# Patient Record
Sex: Female | Born: 1970 | Race: White | Hispanic: No | Marital: Married | Smoking: Never smoker
Health system: Southern US, Community
[De-identification: ages and names within clinical notes are randomized; demographics above are authoritative.]

## PROBLEM LIST (undated history)

## (undated) DIAGNOSIS — Z9889 Other specified postprocedural states: Secondary | ICD-10-CM

## (undated) DIAGNOSIS — T4145XA Adverse effect of unspecified anesthetic, initial encounter: Secondary | ICD-10-CM

## (undated) DIAGNOSIS — N200 Calculus of kidney: Secondary | ICD-10-CM

## (undated) DIAGNOSIS — Z9221 Personal history of antineoplastic chemotherapy: Secondary | ICD-10-CM

## (undated) DIAGNOSIS — K219 Gastro-esophageal reflux disease without esophagitis: Secondary | ICD-10-CM

## (undated) DIAGNOSIS — R112 Nausea with vomiting, unspecified: Secondary | ICD-10-CM

## (undated) DIAGNOSIS — T8859XA Other complications of anesthesia, initial encounter: Secondary | ICD-10-CM

## (undated) DIAGNOSIS — J45909 Unspecified asthma, uncomplicated: Secondary | ICD-10-CM

## (undated) DIAGNOSIS — C801 Malignant (primary) neoplasm, unspecified: Secondary | ICD-10-CM

## (undated) HISTORY — DX: Calculus of kidney: N20.0

## (undated) HISTORY — DX: Unspecified asthma, uncomplicated: J45.909

## (undated) HISTORY — PX: ABDOMINAL HYSTERECTOMY: SHX81

## (undated) HISTORY — PX: CARPAL TUNNEL RELEASE: SHX101

## (undated) HISTORY — DX: Gastro-esophageal reflux disease without esophagitis: K21.9

---

## 1999-01-06 ENCOUNTER — Encounter: Payer: Self-pay | Admitting: Family Medicine

## 1999-01-06 ENCOUNTER — Encounter: Admission: RE | Admit: 1999-01-06 | Discharge: 1999-01-06 | Payer: Self-pay | Admitting: Family Medicine

## 2006-03-05 ENCOUNTER — Observation Stay: Payer: Self-pay

## 2006-03-12 ENCOUNTER — Observation Stay: Payer: Self-pay | Admitting: Obstetrics and Gynecology

## 2006-03-19 ENCOUNTER — Observation Stay: Payer: Self-pay

## 2006-03-26 ENCOUNTER — Observation Stay: Payer: Self-pay | Admitting: Obstetrics and Gynecology

## 2006-04-02 ENCOUNTER — Observation Stay: Payer: Self-pay

## 2006-04-09 ENCOUNTER — Observation Stay: Payer: Self-pay

## 2006-04-13 ENCOUNTER — Ambulatory Visit: Payer: Self-pay | Admitting: Obstetrics and Gynecology

## 2006-04-14 ENCOUNTER — Inpatient Hospital Stay: Payer: Self-pay | Admitting: Obstetrics and Gynecology

## 2007-01-27 HISTORY — PX: ABDOMINAL HYSTERECTOMY: SUR658

## 2007-05-01 ENCOUNTER — Observation Stay: Payer: Self-pay | Admitting: Specialist

## 2007-05-01 ENCOUNTER — Other Ambulatory Visit: Payer: Self-pay

## 2007-06-13 ENCOUNTER — Ambulatory Visit: Payer: Self-pay | Admitting: Specialist

## 2007-12-01 ENCOUNTER — Ambulatory Visit: Payer: Self-pay | Admitting: Obstetrics and Gynecology

## 2007-12-07 ENCOUNTER — Ambulatory Visit: Payer: Self-pay | Admitting: Obstetrics and Gynecology

## 2007-12-08 ENCOUNTER — Ambulatory Visit: Payer: Self-pay | Admitting: Obstetrics and Gynecology

## 2008-01-05 ENCOUNTER — Ambulatory Visit: Payer: Self-pay | Admitting: Obstetrics and Gynecology

## 2008-01-12 ENCOUNTER — Ambulatory Visit: Payer: Self-pay | Admitting: Obstetrics and Gynecology

## 2008-01-16 ENCOUNTER — Inpatient Hospital Stay: Payer: Self-pay | Admitting: Obstetrics and Gynecology

## 2008-12-11 ENCOUNTER — Ambulatory Visit: Payer: Self-pay | Admitting: Obstetrics and Gynecology

## 2009-01-26 ENCOUNTER — Ambulatory Visit: Payer: Self-pay | Admitting: Internal Medicine

## 2009-02-20 ENCOUNTER — Ambulatory Visit: Payer: Self-pay | Admitting: Internal Medicine

## 2009-02-26 ENCOUNTER — Ambulatory Visit: Payer: Self-pay | Admitting: Internal Medicine

## 2009-03-26 ENCOUNTER — Ambulatory Visit: Payer: Self-pay | Admitting: Internal Medicine

## 2009-05-28 ENCOUNTER — Ambulatory Visit: Payer: Self-pay | Admitting: Internal Medicine

## 2009-06-26 ENCOUNTER — Ambulatory Visit: Payer: Self-pay | Admitting: Internal Medicine

## 2009-07-26 ENCOUNTER — Ambulatory Visit: Payer: Self-pay | Admitting: Internal Medicine

## 2009-08-20 ENCOUNTER — Ambulatory Visit: Payer: Self-pay | Admitting: Internal Medicine

## 2009-08-26 ENCOUNTER — Ambulatory Visit: Payer: Self-pay | Admitting: Internal Medicine

## 2010-02-20 ENCOUNTER — Ambulatory Visit: Payer: Self-pay | Admitting: Internal Medicine

## 2010-02-26 ENCOUNTER — Ambulatory Visit: Payer: Self-pay | Admitting: Internal Medicine

## 2010-08-26 ENCOUNTER — Ambulatory Visit: Payer: Self-pay | Admitting: Internal Medicine

## 2010-08-27 ENCOUNTER — Ambulatory Visit: Payer: Self-pay | Admitting: Internal Medicine

## 2012-01-27 HISTORY — PX: CARPAL TUNNEL RELEASE: SHX101

## 2012-03-31 ENCOUNTER — Ambulatory Visit: Payer: Self-pay | Admitting: Obstetrics and Gynecology

## 2012-07-12 ENCOUNTER — Ambulatory Visit: Payer: Self-pay | Admitting: Orthopedic Surgery

## 2012-07-14 LAB — PATHOLOGY REPORT

## 2014-05-18 NOTE — Op Note (Signed)
PATIENT NAME:  Maria Cannon, Maria Cannon MR#:  [id number] DATE OF BIRTH:  October 08, 1970  DATE OF PROCEDURE:  07/12/2012  PREOPERATIVE DIAGNOSES:  Right hand carpal tunnel syndrome and cyst.   POSTOPERATIVE DIAGNOSES:  Mass, right hand, carpal tunnel syndrome.   PROCEDURES:  Carpal tunnel release and excision of mass, right hand.   ANESTHESIA:  General.   SURGEON:  Laurene Footman, MD  DESCRIPTION OF PROCEDURE:  The patient was brought to the operating room and after adequate anesthesia was obtained, the right arm was prepped and draped in the usual sterile fashion. After patient identification and timeout procedures were completed, the tourniquet was raised to 250 mmHg. After having completed the appropriate patient identification and timeout procedure, the carpal tunnel was addressed first with incision in line with the ring metacarpal approximately 2 cm in length. The subcutaneous tissue was spread and the transverse carpal ligament identified. This was incised and a vascular hemostat was placed underneath the ligament to protect the underlying structures. Release was carried out distally until there was fat noted around the nerve. Going proximally, there was compression in the midportion of the carpal tunnel with some deformity to the nerve. After release proximally, there was good vascular blush to the nerve. Exploration of the carpal tunnel revealed no masses. There was mild flexor tenosynovitis. The ligament did appear to be thick. The wound was irrigated and then closed with simple interrupted 4-0 nylon with 10 mL of 0.5% Sensorcaine plain for postoperative analgesia.   Next, incision was made between the third and fourth metacarpal heads on the volar surface. There was a palpable mass. After incision through the skin, the subcutaneous tissues were spread and there was a large amount of fibrofatty tissue that came up from the wound. It appeared to be fibrous and light fatty tissue. This was removed and on  palpation of the wound after removal of this, the mass was no longer apparent. Dissection was carried down to the neurovascular bundle. There was no ganglion or other cyst identified. The wound was then irrigated and this wound was also closed with simple interrupted 4-0 nylon and 7 mL of 0.5% Sensorcaine was infiltrated proximal to the incision for postoperative analgesia. Xeroform, 4 x 4's, Webril and an Ace wrap were applied. The patient was sent to the recovery room in stable condition.   ESTIMATED BLOOD LOSS:  Minimal.   COMPLICATIONS:  None.   SPECIMEN:  The fibrofatty tissue from the palm.   TOURNIQUET TIME:  26 minutes at 250 mmHg.    ____________________________ Laurene Footman, MD mjm:si D: 07/12/2012 22:21:16 ET T: 07/12/2012 23:00:50 ET JOB#: [id number]  cc: Laurene Footman, MD, <Dictator> Laurene Footman MD ELECTRONICALLY SIGNED 07/13/2012 8:25

## 2014-07-12 ENCOUNTER — Other Ambulatory Visit: Payer: Self-pay | Admitting: Obstetrics and Gynecology

## 2014-07-12 DIAGNOSIS — Z1231 Encounter for screening mammogram for malignant neoplasm of breast: Secondary | ICD-10-CM

## 2014-07-13 ENCOUNTER — Ambulatory Visit
Admission: RE | Admit: 2014-07-13 | Discharge: 2014-07-13 | Disposition: A | Discharge status: Home / Self Care | Payer: Self-pay | Source: Ambulatory Visit | Attending: Obstetrics and Gynecology | Admitting: Obstetrics and Gynecology

## 2014-07-13 DIAGNOSIS — Z1231 Encounter for screening mammogram for malignant neoplasm of breast: Secondary | ICD-10-CM | POA: Insufficient documentation

## 2014-07-13 HISTORY — DX: Malignant (primary) neoplasm, unspecified: C80.1

## 2015-07-17 ENCOUNTER — Other Ambulatory Visit: Payer: Self-pay | Admitting: Obstetrics and Gynecology

## 2015-07-17 DIAGNOSIS — Z1231 Encounter for screening mammogram for malignant neoplasm of breast: Secondary | ICD-10-CM

## 2015-07-25 ENCOUNTER — Encounter: Payer: Self-pay | Admitting: Urology

## 2015-07-25 ENCOUNTER — Ambulatory Visit (INDEPENDENT_AMBULATORY_CARE_PROVIDER_SITE_OTHER): Payer: Self-pay | Admitting: Urology

## 2015-07-25 VITALS — BP 145/68 | HR 80 | Ht 66.0 in | Wt 140.1 lb

## 2015-07-25 DIAGNOSIS — R109 Unspecified abdominal pain: Secondary | ICD-10-CM

## 2015-07-25 DIAGNOSIS — R310 Gross hematuria: Secondary | ICD-10-CM

## 2015-07-25 LAB — URINALYSIS, COMPLETE
BILIRUBIN UA: NEGATIVE
GLUCOSE, UA: NEGATIVE
KETONES UA: NEGATIVE
Leukocytes, UA: NEGATIVE
Nitrite, UA: NEGATIVE
PROTEIN UA: NEGATIVE
RBC UA: NEGATIVE
SPEC GRAV UA: 1.025 (ref 1.005–1.030)
UUROB: 0.2 mg/dL (ref 0.2–1.0)
pH, UA: 6 (ref 5.0–7.5)

## 2015-07-25 LAB — MICROSCOPIC EXAMINATION: Bacteria, UA: NONE SEEN

## 2015-07-25 NOTE — Progress Notes (Signed)
[facility] Consult Referred by: Ouida Sills, MD  Chief Complaint: Back pain, dysuria  History of Present Illness: Patient reports a history of back pain on the left side and in the middle. Pain while urinating as well as a fulfilling and stabbing pains. Started about 1 month ago with fatigue and frequency.  Seen by Dr. Ouida Sills June 21 with a normal pelvic exam. UA showed a few wbc's and bacteria but no rbc's.   No gross hematuria - she has not seen blood or blood clots in urine. She had dysuria, urgency and frequency. Pain middle back, but also front, left and right. No Gi symptoms. Sharp stabbing pain. Azo no help. Now LLQ dull, Left low back - sharp. Pain worse at night. Very busy with VBS. Took nitrofurantoin with some relief. Heat, NSAIDs and oxycodone helped. Prior kidney stones. Never needed surgery. Passed two stones in the past.   No GU surgery. Prior c-section and interestingly a TVHx in 2009 for endometriosis and similar LLQ pain.   UA today clear.   Past Medical History  Diagnosis Date  . Cancer ([place]) skin  . GERD (gastroesophageal reflux disease)   . Asthma   . Kidney stones    Past Surgical History  Procedure Laterality Date  . Carpal tunnel release Bilateral 2014  . Abdominal hysterectomy  2009  . Cesarean section  2008    Home Medications:   (Not in a hospital admission) Allergies:  Allergies  Allergen Reactions  . Ciprofloxacin     Other reaction(s): Unknown    Family History  Problem Relation Age of Onset  . Adopted: Yes  . Kidney cancer Neg Hx   . Kidney disease Neg Hx   . Prostate cancer Neg Hx    Social History:  reports that she has never smoked. She does not have any smokeless tobacco history on file. She reports that she does not drink alcohol or use illicit drugs.  ROS: A complete review of systems was performed.  All systems are negative except for pertinent findings as noted. Review of Systems  Constitutional:  Positive for diaphoresis.  All other systems reviewed and are negative.    Physical Exam:  Vital signs in last 24 hours: General:  Alert and oriented, No acute distress HEENT: Normocephalic, atraumatic Cardiovascular: Regular rate and rhythm Lungs: Regular rate and effort Back: No CVA tenderness Extremities: No edema Neurologic: Grossly intact   Laboratory Data:  No results found for this or any previous visit (from the past 24 hour(s)). No results found for this or any previous visit (from the past 240 hour(s)). Creatinine: No results for input(s): CREATININE in the last 168 hours.  Impression/Assessment/plan:  Flank pain - history of kidney stones - plan KUB and renal U/S. There has been no microhematuria or gross hematuria.   Dysuria - UA clear. Uribel prn.    Leigha Olberding 07/25/2015, 3:05 PM

## 2015-08-01 ENCOUNTER — Other Ambulatory Visit: Payer: Self-pay | Admitting: Urology

## 2015-08-01 DIAGNOSIS — R109 Unspecified abdominal pain: Secondary | ICD-10-CM

## 2015-08-02 ENCOUNTER — Ambulatory Visit
Admission: RE | Admit: 2015-08-02 | Discharge: 2015-08-02 | Disposition: A | Discharge status: Home / Self Care | Payer: Self-pay | Source: Ambulatory Visit | Attending: Urology | Admitting: Urology

## 2015-08-02 ENCOUNTER — Ambulatory Visit
Admission: RE | Admit: 2015-08-02 | Discharge: 2015-08-02 | Disposition: A | Discharge status: Home / Self Care | Payer: Self-pay | Source: Ambulatory Visit | Attending: Obstetrics and Gynecology | Admitting: Obstetrics and Gynecology

## 2015-08-02 ENCOUNTER — Other Ambulatory Visit: Payer: Self-pay | Admitting: Obstetrics and Gynecology

## 2015-08-02 DIAGNOSIS — Z1231 Encounter for screening mammogram for malignant neoplasm of breast: Secondary | ICD-10-CM

## 2015-08-02 DIAGNOSIS — R109 Unspecified abdominal pain: Secondary | ICD-10-CM | POA: Insufficient documentation

## 2015-08-02 DIAGNOSIS — N200 Calculus of kidney: Secondary | ICD-10-CM | POA: Insufficient documentation

## 2015-08-02 DIAGNOSIS — N83202 Unspecified ovarian cyst, left side: Secondary | ICD-10-CM | POA: Insufficient documentation

## 2015-08-05 ENCOUNTER — Telehealth: Payer: Self-pay

## 2015-08-05 ENCOUNTER — Telehealth: Payer: Self-pay | Admitting: Urology

## 2015-08-05 NOTE — Telephone Encounter (Signed)
Patient called back and let me know that she does not have a left ovary so there can not be a cyst on it. She is going to keep her appt on the 20th with Dr. Pilar Jarvis because she wants answers to what is going on. I told her that I would have you look at this and maybe have Dr, Erlene Quan check out her [place] to see if she see's anything we are missing. I told her we may call her back or we may not depending on what Dr. Erlene Quan thinks.   Thanks,  Sharyn Lull

## 2015-08-05 NOTE — Telephone Encounter (Signed)
-----   Message from Festus Aloe, MD sent at 08/02/2015  3:22 PM EDT ----- Notify patient her Renal ultrasound Showed normal kidneys and bladder.  There was a stone in the left kidney but it was not obstructing.  She did have a large cyst in the area of the left ovary.  She needs to follow-up with her gynecologist to review.  I believe they sent her to [place] for the consult.  Please forward the ultrasound report to them.  Thank you.

## 2015-08-05 NOTE — Telephone Encounter (Signed)
Spoke with pt in reference RUS results. Made aware to f/u with gynecologist. Pt voiced understanding.  Results faxed to GYN.

## 2015-08-07 NOTE — Telephone Encounter (Signed)
Patient was read the radiology report and explained that there was not a cyst on her ovary but a cystic mass in the area where the ovary should be and that a transvaginal u/s should be done by GYN for further evaluation. Patient verbalized understanding and states she has an apt with her GYN for further evaluation.

## 2015-08-15 ENCOUNTER — Ambulatory Visit: Payer: Self-pay | Admitting: Urology

## 2015-08-19 ENCOUNTER — Encounter
Admission: RE | Admit: 2015-08-19 | Discharge: 2015-08-19 | Disposition: A | Discharge status: Home / Self Care | Payer: Self-pay | Source: Ambulatory Visit | Attending: Obstetrics and Gynecology | Admitting: Obstetrics and Gynecology

## 2015-08-19 DIAGNOSIS — Z01812 Encounter for preprocedural laboratory examination: Secondary | ICD-10-CM | POA: Insufficient documentation

## 2015-08-19 HISTORY — DX: Other complications of anesthesia, initial encounter: T88.59XA

## 2015-08-19 HISTORY — DX: Other specified postprocedural states: R11.2

## 2015-08-19 HISTORY — DX: Adverse effect of unspecified anesthetic, initial encounter: T41.45XA

## 2015-08-19 HISTORY — DX: Other specified postprocedural states: Z98.890

## 2015-08-19 LAB — BASIC METABOLIC PANEL
ANION GAP: 8 (ref 5–15)
BUN: 12 mg/dL (ref 6–20)
CALCIUM: 9.3 mg/dL (ref 8.9–10.3)
CO2: 25 mmol/L (ref 22–32)
Chloride: 101 mmol/L (ref 101–111)
Creatinine, Ser: 0.72 mg/dL (ref 0.44–1.00)
Glucose, Bld: 93 mg/dL (ref 65–99)
Potassium: 3.6 mmol/L (ref 3.5–5.1)
Sodium: 134 mmol/L — ABNORMAL LOW (ref 135–145)

## 2015-08-19 LAB — CBC
HCT: 43 % (ref 35.0–47.0)
HEMOGLOBIN: 14.2 g/dL (ref 12.0–16.0)
MCH: 30.6 pg (ref 26.0–34.0)
MCHC: 33 g/dL (ref 32.0–36.0)
MCV: 92.8 fL (ref 80.0–100.0)
Platelets: 220 10*3/uL (ref 150–440)
RBC: 4.63 MIL/uL (ref 3.80–5.20)
RDW: 12.8 % (ref 11.5–14.5)
WBC: 6.1 10*3/uL (ref 3.6–11.0)

## 2015-08-19 LAB — TYPE AND SCREEN
ABO/RH(D): A NEG
ANTIBODY SCREEN: NEGATIVE

## 2015-08-19 NOTE — H&P (Addendum)
Chief Complaint:  Here for Laparoscopic Left salpingoophorectomy for a complex left adnexal mass. Possible right salpingectomy if tube is still there   [name] is a 45 y.o. female here for Follow-up .pt  OP report reviewed 2009 . Report and path report LSO performed + TVH , but Dr Enzo Bi narrative in note does not describe the left tube and ovary being removed . U/S today demonstrates a 8.6x4.1x5.24 cm cyst anechoic  and 2 complex 1.6 cm cyst all in the left ovary .  Right ovary not seen . Pt now with 3 month h/o left pelvic pain . Urology has evaled and does not think her small urolithiasis is the source of her pain .    Past Medical History:  has a past medical history of Urolithiasis (2009).  Past Surgical History:  has a past surgical history that includes Endoscopic Carpal Tunnel Release; Hysterectomy; and TVH LSO (2009). Family History: family history is not on file. She was adopted. Social History:  reports that she has never smoked. She does not have any smokeless tobacco history on file. OB/GYN History:  OB History    Gravida Para Term Preterm AB TAB SAB Ectopic Multiple Living   3 3 3      1 4       Allergies: is allergic to ciprofloxacin. Medications:  Current Outpatient Prescriptions:  .  nitrofurantoin, macrocrystal-monohydrate, (MACROBID) 100 MG capsule, Take 1 capsule (100 mg total) by mouth 2 (two) times daily. (Patient not taking: Reported on 08/09/2015 ), Disp: 14 capsule, Rfl: 0  Review of Systems: General:                                          No fatigue or weight loss Eyes:                                                         No vision changes Ears:                                                          No hearing difficulty Respiratory:                No cough or shortness of breath Pulmonary:                                      No asthma or shortness of breath Cardiovascular:                     No chest pain, palpitations, dyspnea  on exertion Gastrointestinal:                    No abdominal bloating, chronic diarrhea, constipations, masses, pain or hematochezia Genitourinary:                                 No hematuria, dysuria, abnormal vaginal  discharge, pelvic pain, Menometrorrhagia Lymphatic:                                       No swollen lymph nodes Musculoskeletal:                   No muscle weakness Neurologic:                                      No extremity weakness, syncope, seizure disorder Psychiatric:                                      No history of depression, delusions or suicidal/homicidal ideation    Exam:      Vitals:   08/09/15 1343  BP: 111/75  Pulse: 102    Body mass index is 22.6 kg/(m^2).  WDWN white/ female in NAD   Lungs: CTA  CV : RRR without murmur   pelvic : cx absent  Uterus absent  Left adnexal fullness Right nl    Impression:   The primary encounter diagnosis was Cyst of left ovary. A diagnosis of Chronic female pelvic pain was also pertinent to this visit. Given the size of her cyst  This is most likely the cause of her pain . Confusing op notes , possible residual ovarian remnant with ovarian cyst.   Plan:  L/S removal of ovarian cyst/ adnexal mass. Right salpingectomy if still there .   pt is aware if the potential risks of surgery . All questions answered      Maria Sauger, MD

## 2015-08-19 NOTE — Patient Instructions (Signed)
  Your procedure is scheduled on:08/26/15 Report to Day Surgery. To find out your arrival time please call [phone] between 1PM - 3PM on .08/23/15 Friday Remember: Instructions that are not followed completely may result in serious medical risk, up to and including death, or upon the discretion of your surgeon and anesthesiologist your surgery may need to be rescheduled.    __x__ 1. Do not eat food or drink liquids after midnight. No gum chewing or hard candies.     __x__ 2. No Alcohol for 24 hours before or after surgery.   ____ 3. Do Not Smoke For 24 Hours Prior to Your Surgery.   ____ 4. Bring all medications with you on the day of surgery if instructed.    ___x_ 5. Notify your doctor if there is any change in your medical condition     (cold, fever, infections).       Do not wear jewelry, make-up, hairpins, clips or nail polish.  Do not wear lotions, powders, or perfumes. You may wear deodorant.  Do not shave 48 hours prior to surgery. Men may shave face and neck.  Do not bring valuables to the hospital.    [facility] is not responsible for any belongings or valuables.               Contacts, dentures or bridgework may not be worn into surgery.  Leave your suitcase in the car. After surgery it may be brought to your room.  For patients admitted to the hospital, discharge time is determined by your                treatment team.   Patients discharged the day of surgery will not be allowed to drive home.   Please read over the following fact sheets that you were given:   Surgical Site Infection Prevention   ____ Take these medicines the morning of surgery with A SIP OF WATER:    1. none  2.   3.   4.  5.  6.  ____ Fleet Enema (as directed)   __x__ Use CHG Soap as directed  ____ Use inhalers on the day of surgery  ____ Stop metformin 2 days prior to surgery    ____ Take 1/2 of usual insulin dose the night before surgery and none on the morning of surgery.    ____ Stop Coumadin/Plavix/aspirin on   _x___ Stop Anti-inflammatories on Tylenol only until after surgery   ____ Stop supplements until after surgery.    ____ Bring C-Pap to the hospital.

## 2015-08-26 ENCOUNTER — Ambulatory Visit: Payer: Self-pay | Admitting: Certified Registered Nurse Anesthetist

## 2015-08-26 ENCOUNTER — Encounter
Admission: RE | Disposition: A | Discharge status: Home / Self Care | Payer: Self-pay | Source: Ambulatory Visit | Attending: Obstetrics and Gynecology

## 2015-08-26 ENCOUNTER — Ambulatory Visit
Admission: RE | Admit: 2015-08-26 | Discharge: 2015-08-26 | Disposition: A | Discharge status: Home / Self Care | Payer: Self-pay | Source: Ambulatory Visit | Attending: Obstetrics and Gynecology | Admitting: Obstetrics and Gynecology

## 2015-08-26 DIAGNOSIS — J45909 Unspecified asthma, uncomplicated: Secondary | ICD-10-CM | POA: Insufficient documentation

## 2015-08-26 DIAGNOSIS — N736 Female pelvic peritoneal adhesions (postinfective): Secondary | ICD-10-CM | POA: Insufficient documentation

## 2015-08-26 DIAGNOSIS — N7011 Chronic salpingitis: Secondary | ICD-10-CM | POA: Insufficient documentation

## 2015-08-26 DIAGNOSIS — N83202 Unspecified ovarian cyst, left side: Secondary | ICD-10-CM | POA: Insufficient documentation

## 2015-08-26 DIAGNOSIS — Z881 Allergy status to other antibiotic agents status: Secondary | ICD-10-CM | POA: Insufficient documentation

## 2015-08-26 DIAGNOSIS — Z87442 Personal history of urinary calculi: Secondary | ICD-10-CM | POA: Insufficient documentation

## 2015-08-26 DIAGNOSIS — D270 Benign neoplasm of right ovary: Secondary | ICD-10-CM | POA: Insufficient documentation

## 2015-08-26 DIAGNOSIS — N8301 Follicular cyst of right ovary: Secondary | ICD-10-CM | POA: Insufficient documentation

## 2015-08-26 HISTORY — PX: LAPAROSCOPIC LYSIS OF ADHESIONS: SHX5905

## 2015-08-26 HISTORY — PX: LAPAROSCOPIC SALPINGO OOPHERECTOMY: SHX5927

## 2015-08-26 SURGERY — SALPINGO-OOPHORECTOMY, LAPAROSCOPIC
Anesthesia: General | Laterality: Right | Wound class: Clean Contaminated

## 2015-08-26 MED ORDER — ONDANSETRON HCL 4 MG/2ML IJ SOLN
INTRAMUSCULAR | Status: DC | PRN
  Administered 2015-08-26: 4 mg via INTRAVENOUS

## 2015-08-26 MED ORDER — ROCURONIUM BROMIDE 100 MG/10ML IV SOLN
INTRAVENOUS | Status: DC | PRN
  Administered 2015-08-26 (×2): 10 mg via INTRAVENOUS
  Administered 2015-08-26: 35 mg via INTRAVENOUS
  Administered 2015-08-26: 20 mg via INTRAVENOUS
  Administered 2015-08-26: 10 mg via INTRAVENOUS

## 2015-08-26 MED ORDER — SUGAMMADEX SODIUM 200 MG/2ML IV SOLN
INTRAVENOUS | Status: DC | PRN
  Administered 2015-08-26: 120 mg via INTRAVENOUS

## 2015-08-26 MED ORDER — LACTATED RINGERS IV SOLN
INTRAVENOUS | Status: DC | PRN
  Administered 2015-08-26: 1200 mL

## 2015-08-26 MED ORDER — SCOPOLAMINE 1 MG/3DAYS TD PT72
1.0000 | MEDICATED_PATCH | Freq: Once | TRANSDERMAL | Status: DC
  Administered 2015-08-26: 1.5 mg via TRANSDERMAL

## 2015-08-26 MED ORDER — FAMOTIDINE 20 MG PO TABS
ORAL_TABLET | ORAL | Status: AC
  Administered 2015-08-26: 20 mg via ORAL
  Filled 2015-08-26: qty 1

## 2015-08-26 MED ORDER — MIDAZOLAM HCL 2 MG/2ML IJ SOLN
INTRAMUSCULAR | Status: DC | PRN
  Administered 2015-08-26 (×2): 1 mg via INTRAVENOUS

## 2015-08-26 MED ORDER — HYDROCODONE-ACETAMINOPHEN 5-325 MG PO TABS
ORAL_TABLET | ORAL | Status: AC
  Filled 2015-08-26: qty 2

## 2015-08-26 MED ORDER — BUPIVACAINE HCL (PF) 0.5 % IJ SOLN
INTRAMUSCULAR | Status: AC
  Filled 2015-08-26: qty 30

## 2015-08-26 MED ORDER — PROPOFOL 10 MG/ML IV BOLUS
INTRAVENOUS | Status: DC | PRN
  Administered 2015-08-26: 150 ug via INTRAVENOUS

## 2015-08-26 MED ORDER — STERILE WATER FOR IRRIGATION IR SOLN
Status: DC | PRN
  Administered 2015-08-26: 300 mL

## 2015-08-26 MED ORDER — SODIUM CHLORIDE 0.9 % IJ SOLN
INTRAMUSCULAR | Status: AC
  Filled 2015-08-26: qty 10

## 2015-08-26 MED ORDER — DEXAMETHASONE SODIUM PHOSPHATE 10 MG/ML IJ SOLN
INTRAMUSCULAR | Status: DC | PRN
  Administered 2015-08-26: 6 mg via INTRAVENOUS

## 2015-08-26 MED ORDER — FENTANYL CITRATE (PF) 100 MCG/2ML IJ SOLN
25.0000 ug | INTRAMUSCULAR | Status: DC | PRN
  Administered 2015-08-26 (×2): 25 ug via INTRAVENOUS

## 2015-08-26 MED ORDER — FAMOTIDINE 20 MG PO TABS
20.0000 mg | ORAL_TABLET | Freq: Once | ORAL | Status: AC
  Administered 2015-08-26: 20 mg via ORAL

## 2015-08-26 MED ORDER — HYDROMORPHONE HCL 1 MG/ML IJ SOLN
INTRAMUSCULAR | Status: DC | PRN
  Administered 2015-08-26: 1 mg via INTRAVENOUS

## 2015-08-26 MED ORDER — LACTATED RINGERS IV SOLN
INTRAVENOUS | Status: DC
  Administered 2015-08-26: 07:00:00 via INTRAVENOUS

## 2015-08-26 MED ORDER — FENTANYL CITRATE (PF) 100 MCG/2ML IJ SOLN
INTRAMUSCULAR | Status: DC | PRN
  Administered 2015-08-26: 100 ug via INTRAVENOUS
  Administered 2015-08-26: 50 ug via INTRAVENOUS
  Administered 2015-08-26: 25 ug via INTRAVENOUS

## 2015-08-26 MED ORDER — METHYLENE BLUE 0.5 % INJ SOLN
INTRAVENOUS | Status: AC
  Filled 2015-08-26: qty 10

## 2015-08-26 MED ORDER — HYDROCODONE-ACETAMINOPHEN 5-325 MG PO TABS
2.0000 | ORAL_TABLET | ORAL | Status: DC | PRN
  Administered 2015-08-26: 2 via ORAL

## 2015-08-26 MED ORDER — LIDOCAINE HCL (CARDIAC) 20 MG/ML IV SOLN
INTRAVENOUS | Status: DC | PRN
  Administered 2015-08-26: 60 mg via INTRATRACHEAL

## 2015-08-26 MED ORDER — PROMETHAZINE HCL 25 MG/ML IJ SOLN
INTRAMUSCULAR | Status: AC
Start: 2015-08-26 — End: 2015-08-26
  Administered 2015-08-26: 6.25 mg via INTRAVENOUS
  Filled 2015-08-26: qty 1

## 2015-08-26 MED ORDER — MEPERIDINE HCL 25 MG/ML IJ SOLN: 6.2500 mg | INTRAMUSCULAR | Status: DC | PRN

## 2015-08-26 MED ORDER — FENTANYL CITRATE (PF) 100 MCG/2ML IJ SOLN
INTRAMUSCULAR | Status: AC
  Administered 2015-08-26: 25 ug via INTRAVENOUS
  Filled 2015-08-26: qty 2

## 2015-08-26 MED ORDER — BUPIVACAINE HCL 0.5 % IJ SOLN
INTRAMUSCULAR | Status: DC | PRN
  Administered 2015-08-26: 8 mL

## 2015-08-26 MED ORDER — OXYCODONE HCL 5 MG PO TABS: 5.0000 mg | ORAL_TABLET | Freq: Once | ORAL | Status: DC | PRN

## 2015-08-26 MED ORDER — LACTATED RINGERS IV SOLN
INTRAVENOUS | Status: DC
  Administered 2015-08-26 (×2): via INTRAVENOUS

## 2015-08-26 MED ORDER — PROMETHAZINE HCL 25 MG/ML IJ SOLN
6.2500 mg | INTRAMUSCULAR | Status: DC | PRN
  Administered 2015-08-26: 6.25 mg via INTRAVENOUS

## 2015-08-26 MED ORDER — OXYCODONE HCL 5 MG/5ML PO SOLN: 5.0000 mg | Freq: Once | ORAL | Status: DC | PRN

## 2015-08-26 MED ORDER — SCOPOLAMINE 1 MG/3DAYS TD PT72
MEDICATED_PATCH | TRANSDERMAL | Status: AC
  Administered 2015-08-26: 1.5 mg via TRANSDERMAL
  Filled 2015-08-26: qty 1

## 2015-08-26 SURGICAL SUPPLY — 41 items
BLADE SURG SZ11 CARB STEEL (BLADE) ×4 IMPLANT
CANISTER SUCT 1200ML W/VALVE (MISCELLANEOUS) ×4 IMPLANT
CATH FOLEY 2WAY  5CC 16FR (CATHETERS) ×2
CATH ROBINSON RED A/P 16FR (CATHETERS) ×4 IMPLANT
CATH URTH 16FR FL 2W BLN LF (CATHETERS) ×6 IMPLANT
CHLORAPREP W/TINT 26ML (MISCELLANEOUS) ×4 IMPLANT
DRSG TEGADERM 2-3/8X2-3/4 SM (GAUZE/BANDAGES/DRESSINGS) ×12 IMPLANT
GAUZE SPONGE NON-WVN 2X2 STRL (MISCELLANEOUS) ×6 IMPLANT
GLOVE BIO SURGEON STRL SZ 6.5 (GLOVE) ×4 IMPLANT
GLOVE BIO SURGEON STRL SZ8 (GLOVE) ×24 IMPLANT
GOWN STRL REUS W/ TWL LRG LVL3 (GOWN DISPOSABLE) ×3 IMPLANT
GOWN STRL REUS W/ TWL XL LVL3 (GOWN DISPOSABLE) ×3 IMPLANT
GOWN STRL REUS W/TWL LRG LVL3 (GOWN DISPOSABLE) ×1
GOWN STRL REUS W/TWL XL LVL3 (GOWN DISPOSABLE) ×1
GRADUATE 1200CC STRL 31836 (MISCELLANEOUS) ×4 IMPLANT
GRASPER SUT TROCAR 14GX15 (MISCELLANEOUS) ×4 IMPLANT
IRRIGATION STRYKERFLOW (MISCELLANEOUS) IMPLANT
IRRIGATOR STRYKERFLOW (MISCELLANEOUS)
IV NS 1000ML (IV SOLUTION) ×1
IV NS 1000ML BAXH (IV SOLUTION) ×3 IMPLANT
KIT RM TURNOVER CYSTO AR (KITS) ×4 IMPLANT
LABEL OR SOLS (LABEL) ×4 IMPLANT
NS IRRIG 500ML POUR BTL (IV SOLUTION) ×4 IMPLANT
PACK GYN LAPAROSCOPIC (MISCELLANEOUS) ×4 IMPLANT
PAD OB MATERNITY 4.3X12.25 (PERSONAL CARE ITEMS) ×4 IMPLANT
PAD PREP 24X41 OB/GYN DISP (PERSONAL CARE ITEMS) ×4 IMPLANT
POUCH ENDO CATCH 10MM SPEC (MISCELLANEOUS) ×4 IMPLANT
SCISSORS METZENBAUM CVD 33 (INSTRUMENTS) IMPLANT
SHEARS HARMONIC ACE PLUS 36CM (ENDOMECHANICALS) IMPLANT
SLEEVE ENDOPATH XCEL 5M (ENDOMECHANICALS) ×4 IMPLANT
SPONGE VERSALON 2X2 STRL (MISCELLANEOUS) ×2
STRIP CLOSURE SKIN 1/2X4 (GAUZE/BANDAGES/DRESSINGS) ×4 IMPLANT
SUT VIC AB 2-0 UR6 27 (SUTURE) ×8 IMPLANT
SUT VIC AB 4-0 SH 27 (SUTURE) ×1
SUT VIC AB 4-0 SH 27XANBCTRL (SUTURE) ×3 IMPLANT
SWABSTK COMLB BENZOIN TINCTURE (MISCELLANEOUS) ×4 IMPLANT
SYRINGE IRR TOOMEY STRL 70CC (SYRINGE) ×4 IMPLANT
TROCAR ENDO BLADELESS 11MM (ENDOMECHANICALS) ×4 IMPLANT
TROCAR XCEL NON-BLD 5MMX100MML (ENDOMECHANICALS) ×4 IMPLANT
TROCAR XCEL UNIV SLVE 11M 100M (ENDOMECHANICALS) ×4 IMPLANT
TUBING INSUFFLATOR HI FLOW (MISCELLANEOUS) ×4 IMPLANT

## 2015-08-26 NOTE — Progress Notes (Signed)
Pt ready for L/S LSO / removal of left adnexal mass + right salpingectomy. NPO  . LAbs reviewed . All questions answered .

## 2015-08-26 NOTE — Transfer of Care (Signed)
Immediate Anesthesia Transfer of Care Note  Patient: Maria Cannon  Procedure(s) Performed: Procedure(s): LAPAROSCOPIC SALPINGO OOPHORECTOMY (Right) EXTENSIVE LYSIS OF ADHESIONS  Patient Location: PACU  Anesthesia Type:General  Level of Consciousness: awake  Airway & Oxygen Therapy: Patient Spontanous Breathing  Post-op Assessment: Report given to RN, Post -op Vital signs reviewed and stable, Patient moving all extremities X 4 and Patient able to stick tongue midline  Post vital signs: Reviewed and stable  Last Vitals:  Vitals:   08/26/15 0619 08/26/15 0951  BP: 122/67 129/78  Pulse: (!) 59 78  Resp: 16 (!) 9  Temp: 36.6 C 36.4 C    Last Pain:  Vitals:   08/26/15 0951  TempSrc: Temporal  PainSc:          Complications: No apparent anesthesia complications

## 2015-08-26 NOTE — Anesthesia Postprocedure Evaluation (Signed)
Anesthesia Post Note  Patient: Maria Cannon  Procedure(s) Performed: Procedure(s) (LRB): LAPAROSCOPIC SALPINGO OOPHORECTOMY (Right) EXTENSIVE LYSIS OF ADHESIONS  Patient location during evaluation: PACU Anesthesia Type: General Level of consciousness: awake and alert and oriented Pain management: pain level controlled Vital Signs Assessment: post-procedure vital signs reviewed and stable Respiratory status: spontaneous breathing, nonlabored ventilation and respiratory function stable Cardiovascular status: blood pressure returned to baseline and stable Postop Assessment: no signs of nausea or vomiting Anesthetic complications: no    Last Vitals:  Vitals:   08/26/15 1106 08/26/15 1115  BP: 134/67 (!) 152/78  Pulse: (!) 49 (!) 52  Resp: 14 16  Temp:  36.4 C    Last Pain:  Vitals:   08/26/15 1115  TempSrc: Tympanic  PainSc: 5                  Keegen Heffern

## 2015-08-26 NOTE — Anesthesia Preprocedure Evaluation (Signed)
Anesthesia Evaluation  Patient identified by MRN, date of birth, ID band Patient awake    Reviewed: Allergy & Precautions, NPO status , Patient's Chart, lab work & pertinent test results  History of Anesthesia Complications (+) PONV and history of anesthetic complications  Airway Mallampati: I  TM Distance: >3 FB Neck ROM: Full    Dental no notable dental hx.    Pulmonary asthma (allergen (cats) induced) ,    breath sounds clear to auscultation- rhonchi (-) wheezing      Cardiovascular Exercise Tolerance: Good (-) hypertension(-) CAD and (-) Past MI  Rhythm:Regular Rate:Normal - Systolic murmurs and - Diastolic murmurs    Neuro/Psych negative neurological ROS  negative psych ROS   GI/Hepatic Neg liver ROS, GERD  ,  Endo/Other  negative endocrine ROSneg diabetes  Renal/GU negative Renal ROS     Musculoskeletal negative musculoskeletal ROS (+)   Abdominal (+) - obese,   Peds  Hematology negative hematology ROS (+)   Anesthesia Other Findings   Reproductive/Obstetrics                             Anesthesia Physical Anesthesia Plan  ASA: I  Anesthesia Plan: General   Post-op Pain Management:    Induction: Intravenous  Airway Management Planned: Oral ETT  Additional Equipment:   Intra-op Plan:   Post-operative Plan: Extubation in OR  Informed Consent: I have reviewed the patients History and Physical, chart, labs and discussed the procedure including the risks, benefits and alternatives for the proposed anesthesia with the patient or authorized representative who has indicated his/her understanding and acceptance.   Dental advisory given  Plan Discussed with: Anesthesiologist and CRNA  Anesthesia Plan Comments:         Anesthesia Quick Evaluation

## 2015-08-26 NOTE — Discharge Instructions (Signed)

## 2015-08-26 NOTE — Progress Notes (Signed)
Peri pad clean and dry.

## 2015-08-26 NOTE — Brief Op Note (Signed)
08/26/2015  9:34 AM  PATIENT:  Maria Cannon  45 y.o. 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  Significant pelvic adhesions  PROCEDURE:  Procedure(s): LAPAROSCOPIC SALPINGO OOPHORECTOMY (Right) EXTENSIVE LYSIS OF ADHESIONS Retrograde filling of the bladder SURGEON:  Surgeon(s) and Role:    * Boykin Nearing, MD - Primary    * Benjaman Kindler, MD - Assisting  PHYSICIAN ASSISTANT:   ASSISTANTS: none   ANESTHESIA:   general  EBL:  Total I/O In: 1000 [I.V.:1000] Out: 330 [Urine:300; Blood:30]  BLOOD ADMINISTERED:none  DRAINS: none   LOCAL MEDICATIONS USED:  MARCAINE     SPECIMEN:  Source of Specimen:  right tube and ovary  DISPOSITION OF SPECIMEN:  PATHOLOGY  COUNTS:  YES  TOURNIQUET:  * No tourniquets in log *  DICTATION: .Other Dictation: Dictation Number verbal   PLAN OF CARE: Discharge to home after PACU  PATIENT DISPOSITION:  PACU - hemodynamically stable.   Delay start of Pharmacological VTE agent (>24hrs) due to surgical blood loss or risk of bleeding: not applicable

## 2015-08-26 NOTE — Anesthesia Procedure Notes (Signed)
Procedure Name: Intubation Date/Time: 08/26/2015 7:20 AM Performed by: Rockne Coons Pre-anesthesia Checklist: Patient identified, Emergency Drugs available, Suction available, Patient being monitored and Timeout performed Oxygen Delivery Method: Circle system utilized Preoxygenation: Pre-oxygenation with 100% oxygen Intubation Type: IV induction Ventilation: Mask ventilation without difficulty Laryngoscope Size: Mac and 3 Grade View: Grade I Tube type: Oral Tube size: 7.0 mm Number of attempts: 1 Airway Equipment and Method: Stylet Placement Confirmation: ETT inserted through vocal cords under direct vision and breath sounds checked- equal and bilateral Secured at: 22 cm Tube secured with: Tape Dental Injury: Teeth and Oropharynx as per pre-operative assessment

## 2015-08-27 LAB — SURGICAL PATHOLOGY

## 2015-08-27 NOTE — Op Note (Signed)
NAME:  Maria Cannon, Maria Cannon NO.:  MEDICAL RECORD NO.:  [id number]  LOCATION:                                 FACILITY:  PHYSICIAN:  Laverta Baltimore, MDDATE OF BIRTH:  1971/01/06  DATE OF PROCEDURE: DATE OF DISCHARGE:                              OPERATIVE REPORT   PREOPERATIVE DIAGNOSIS: 1. Chronic left pelvic pain. 2. Left adnexal mass.  POSTOPERATIVE DIAGNOSIS: 1. Right hydrosalpinx with attachment to the left pelvic sidewall. 2. Significant pelvic adhesive disease.  PROCEDURE: 1. Laparoscopic left salpingo-oophorectomy. 2. Extensive adhesiolysis incorporating greater than 50% of total     operating time. 3. Retrograde filling of the bladder.  ANESTHESIA:  General endotracheal anesthesia.  SURGEON:  Laverta Baltimore, MD  FIRST ASSISTANT:  Leafy Ro.  INDICATIONS:  A 45 year old, gravida 3, para 3 patient with greater than 31-month history of chronic left lower quadrant pain.  The patient is status post a vaginal hysterectomy and left salpingo-oophorectomy and a previous cesarean section.  Ultrasound in the office demonstrates a 6 x 5 cm left adnexal mass.  DESCRIPTION OF PROCEDURE:  After adequate general endotracheal anesthesia, patient was placed in dorsal supine position with the legs in the [place] stirrups.  The patient's abdomen was prepped and draped in normal sterile fashion.  Red Robinson catheter was placed and yielded 200 mL of clear urine.  Sponge stick was placed in the vagina to be used for vaginal manipulation during the procedure.  Gloves were changed. Time-out was performed and attention was directed to the patient's abdomen where 15 mm infraumbilical incision was made after injecting with 0.5% Marcaine.  The laparoscope was advanced into the abdominal cavity under direct visualization with the Optiview cannula.  Upon entry into the abdominal cavity, the patient's abdomen was insufflated.  A 2nd port site was placed 3 cm  medial to the left anterior iliac spine under direct visualization.  The #10/11 port was placed into the abdominal cavity under direct visualization.  Third port was placed in the right lower quadrant, again 3 cm medial to the right anterior iliac spine and a 5 mm trocar was advanced into the abdominal cavity under direct visualization.  Attention was directed to the patient's pelvis after she was placed in Trendelenburg.  There was a large cystic mass, was consistent with a hydrosalpinx that was originating from the right side of the pelvis with significant adhesions to the left pelvic sidewall. Next, 75% of total operating time incorporated the laparoscopic lysis of adhesions to free the right tube and ovary off the left sidewall.  Once the mass was freed from the left sidewall, ureters could be identified bilaterally which were clear from the dissection field.  Attempt was then made to dissect the hydrosalpinx off the right infundibulopelvic ligament which while dissecting this tissue, surgeon encountered some bleeding from the IP.  At this point, surgeon felt it was prudent to cauterize the right infundibulopelvic ligament.  Given the amount of bleeding and the difficulty of trying to dissect the fallopian tube away from the ovary.  Right tube and ovary were then transected and placed into an EndoCatch bag and removed through the left lower port site.  Given the extensive dissection of the vaginal cuff and the attachments, the bladder was then retrograde filled with methylene blue and saline. There was no bladder defects noted.  Gloves were changed again and more adhesiolysis was performed at this time with the omentum scarring down to the left descending colon and the sigmoid colon attached to the left sidewall.  Omentum was freed and the colon seemed to have a normal course.  The patient's abdomen was copiously irrigated.  The ureters were identified with normal peristaltic activity.   Upper abdomen appeared normal.  The appendix appeared normal.  The infraumbilical and the left lower port site were closed with 2 layers; the 1st layer, fascial layer with 2-0 Vicryl suture and all 3 incisions were closed with 4-0 Vicryl suture, interrupted subcuticular stitch.  Each was covered with gauze and Tegaderm.  Good hemostasis was noted.  There were no complications.  ESTIMATED BLOOD LOSS:  30 mL.  URINE OUTPUT:  300 mL.  INTRAOPERATIVE FLUIDS:  1000 mL.  The patient was taken to recovery room in good condition.          ______________________________ Laverta Baltimore, MD     TS/MEDQ  D:  08/26/2015  T:  08/26/2015  Job:  [id number]

## 2016-04-17 ENCOUNTER — Ambulatory Visit: Payer: Self-pay

## 2016-08-05 ENCOUNTER — Other Ambulatory Visit: Payer: Self-pay | Admitting: Obstetrics and Gynecology

## 2016-08-05 DIAGNOSIS — Z1239 Encounter for other screening for malignant neoplasm of breast: Secondary | ICD-10-CM

## 2016-09-14 ENCOUNTER — Ambulatory Visit: Payer: Self-pay | Attending: Oncology | Admitting: *Deleted

## 2016-09-14 ENCOUNTER — Encounter: Payer: Self-pay | Admitting: *Deleted

## 2016-09-14 VITALS — BP 113/67 | HR 73 | Temp 97.8°F | Ht 66.0 in | Wt 140.0 lb

## 2016-09-14 DIAGNOSIS — N63 Unspecified lump in unspecified breast: Secondary | ICD-10-CM

## 2016-09-14 NOTE — Progress Notes (Signed)
Subjective:     Patient ID: Maria Cannon, female   DOB: 1970-11-01, 46 y.o.   MRN: [id number]  HPI   Review of Systems     Objective:   Physical Exam  Pulmonary/Chest: Right breast exhibits no inverted nipple, no mass, no nipple discharge, no skin change and no tenderness. Left breast exhibits mass. Left breast exhibits no inverted nipple, no nipple discharge, no skin change and no tenderness. Breasts are symmetrical.         Assessment:     46 year old White female presents to [facility] for clinical breast exam and mammogram only.  Patient with a history of total hysterectomy in 2017.  On clinical breast exam I can palpate an approximate 2 cm thickening at 3:00 left breast.  Patient states the area was very tender to palpation.  Taught self breast awareness.  Patient has been screened for eligibility.  She does not have any insurance, Medicare or Medicaid.  She also meets financial eligibility.  Hand-out given on the Affordable Care Act.    Plan:     Bilateral diagnostic mammogram and ultrasound ordered.  Will follow-up per [facility] protocol.

## 2016-09-22 ENCOUNTER — Ambulatory Visit
Admission: RE | Admit: 2016-09-22 | Discharge: 2016-09-22 | Disposition: A | Discharge status: Home / Self Care | Payer: Self-pay | Source: Ambulatory Visit | Attending: Oncology | Admitting: Oncology

## 2016-09-22 ENCOUNTER — Other Ambulatory Visit: Payer: Self-pay | Admitting: *Deleted

## 2016-09-22 DIAGNOSIS — N6320 Unspecified lump in the left breast, unspecified quadrant: Secondary | ICD-10-CM | POA: Insufficient documentation

## 2016-09-22 DIAGNOSIS — N63 Unspecified lump in unspecified breast: Secondary | ICD-10-CM

## 2016-10-02 ENCOUNTER — Encounter: Payer: Self-pay | Admitting: *Deleted

## 2016-10-02 NOTE — Progress Notes (Signed)
Talked to patient today and reviewed her normal mammogram and ultrasound results.  Offered patient an opportunity for a surgical consult, but states "that's really how it has been for years.  I don't think I need to go."  Encouraged to follow-up with annual screening in one year.  [facility] to [place].

## 2017-12-03 ENCOUNTER — Other Ambulatory Visit: Payer: Self-pay | Admitting: Obstetrics and Gynecology

## 2017-12-03 DIAGNOSIS — Z1231 Encounter for screening mammogram for malignant neoplasm of breast: Secondary | ICD-10-CM

## 2017-12-30 ENCOUNTER — Ambulatory Visit
Admission: RE | Admit: 2017-12-30 | Discharge: 2017-12-30 | Disposition: A | Discharge status: Home / Self Care | Payer: Self-pay | Source: Ambulatory Visit | Attending: Obstetrics and Gynecology | Admitting: Obstetrics and Gynecology

## 2017-12-30 DIAGNOSIS — Z1231 Encounter for screening mammogram for malignant neoplasm of breast: Secondary | ICD-10-CM | POA: Insufficient documentation

## 2018-12-28 ENCOUNTER — Other Ambulatory Visit: Payer: Self-pay | Admitting: Obstetrics and Gynecology

## 2018-12-28 DIAGNOSIS — Z1231 Encounter for screening mammogram for malignant neoplasm of breast: Secondary | ICD-10-CM

## 2019-01-03 ENCOUNTER — Other Ambulatory Visit: Payer: Self-pay

## 2019-01-03 ENCOUNTER — Ambulatory Visit
Admission: RE | Admit: 2019-01-03 | Discharge: 2019-01-03 | Disposition: A | Discharge status: Home / Self Care | Payer: Self-pay | Source: Ambulatory Visit | Attending: Obstetrics and Gynecology | Admitting: Obstetrics and Gynecology

## 2019-01-03 DIAGNOSIS — Z1231 Encounter for screening mammogram for malignant neoplasm of breast: Secondary | ICD-10-CM | POA: Insufficient documentation

## 2019-01-03 HISTORY — DX: Personal history of antineoplastic chemotherapy: Z92.21

## 2020-01-11 ENCOUNTER — Other Ambulatory Visit: Payer: Self-pay | Admitting: Obstetrics and Gynecology

## 2020-01-11 DIAGNOSIS — Z1231 Encounter for screening mammogram for malignant neoplasm of breast: Secondary | ICD-10-CM

## 2020-01-25 ENCOUNTER — Other Ambulatory Visit: Payer: Self-pay

## 2020-01-25 ENCOUNTER — Ambulatory Visit
Admission: RE | Admit: 2020-01-25 | Discharge: 2020-01-25 | Disposition: A | Discharge status: Home / Self Care | Payer: Self-pay | Source: Ambulatory Visit | Attending: Obstetrics and Gynecology | Admitting: Obstetrics and Gynecology

## 2020-01-25 DIAGNOSIS — Z1231 Encounter for screening mammogram for malignant neoplasm of breast: Secondary | ICD-10-CM | POA: Insufficient documentation

## 2020-06-22 ENCOUNTER — Other Ambulatory Visit: Payer: Self-pay

## 2020-06-22 DIAGNOSIS — J45909 Unspecified asthma, uncomplicated: Secondary | ICD-10-CM | POA: Insufficient documentation

## 2020-06-22 DIAGNOSIS — Z2914 Encounter for prophylactic rabies immune globin: Secondary | ICD-10-CM | POA: Insufficient documentation

## 2020-06-22 DIAGNOSIS — Z23 Encounter for immunization: Secondary | ICD-10-CM | POA: Insufficient documentation

## 2020-06-22 DIAGNOSIS — Z859 Personal history of malignant neoplasm, unspecified: Secondary | ICD-10-CM | POA: Insufficient documentation

## 2020-06-22 DIAGNOSIS — Z203 Contact with and (suspected) exposure to rabies: Secondary | ICD-10-CM | POA: Insufficient documentation

## 2020-06-22 NOTE — ED Triage Notes (Signed)
Pt with possible rabid fox saliva exposure

## 2020-06-23 ENCOUNTER — Emergency Department
Admission: EM | Admit: 2020-06-23 | Discharge: 2020-06-23 | Disposition: A | Discharge status: Home / Self Care | Payer: Self-pay | Attending: Emergency Medicine | Admitting: Emergency Medicine

## 2020-06-23 DIAGNOSIS — Z203 Contact with and (suspected) exposure to rabies: Secondary | ICD-10-CM

## 2020-06-23 MED ORDER — RABIES IMMUNE GLOBULIN 150 UNIT/ML IM INJ
20.0000 [IU]/kg | INJECTION | Freq: Once | INTRAMUSCULAR | Status: AC
  Administered 2020-06-23: 1350 [IU] via INTRAMUSCULAR
  Filled 2020-06-23: qty 9

## 2020-06-23 MED ORDER — RABIES VACCINE, PCEC IM SUSR
1.0000 mL | Freq: Once | INTRAMUSCULAR | Status: AC
  Administered 2020-06-23: 1 mL via INTRAMUSCULAR
  Filled 2020-06-23: qty 1

## 2020-06-23 NOTE — ED Notes (Signed)
No reaction noted.

## 2020-06-23 NOTE — ED Notes (Signed)
MD at bedside. 

## 2020-06-23 NOTE — ED Provider Notes (Signed)
[facility] Emergency Department Provider Note  ____________________________________________  Time seen: Approximately 1:36 AM  I have reviewed the triage vital signs and the nursing notes.   HISTORY  Chief Complaint possible rabies exposure   HPI Maria Cannon is a 50 y.o. female with history of non-Hodgkin's lymphoma in remission  who presents for evaluation of possible exposure to rabies. One of patient's dog was found covered in blood and saliva and a dead fox was found outside in the yard where the dog was.  She touched the dog while helping clean it up before the fox was discovered.  Patient was not bitten by the fox.  She reports receiving all but one of the vaccination series over 20 years ago after being bitten by cat. She does have the dead Fox in the freezer at her house for testing.  [facility] department has been contacted and animal control will collect the fox on Tuesday for analysis.  Past Medical History:  Diagnosis Date  . Asthma   . Cancer ([place]) skin  . Complication of anesthesia   . GERD (gastroesophageal reflux disease)   . Kidney stones   . Personal history of chemotherapy   . PONV (postoperative nausea and vomiting)     Patient Active Problem List   Diagnosis Date Noted  . Flank pain 07/25/2015    Past Surgical History:  Procedure Laterality Date  . ABDOMINAL HYSTERECTOMY  2009  . ABDOMINAL HYSTERECTOMY    . CARPAL TUNNEL RELEASE Bilateral 2014  . CARPAL TUNNEL RELEASE Bilateral   . CESAREAN SECTION  2008  . LAPAROSCOPIC LYSIS OF ADHESIONS  08/26/2015   Procedure: EXTENSIVE LYSIS OF ADHESIONS;  Surgeon: Boykin Nearing, MD;  Location: [facility] ORS;  Service: Gynecology;;  . LAPAROSCOPIC SALPINGO OOPHERECTOMY Right 08/26/2015   Procedure: LAPAROSCOPIC SALPINGO OOPHORECTOMY;  Surgeon: Boykin Nearing, MD;  Location: [facility] ORS;  Service: Gynecology;  Laterality: Right;    Prior to Admission medications   Not on File     Allergies Ciprofloxacin  Family History  Adopted: Yes  Problem Relation Age of Onset  . Kidney cancer Neg Hx   . Kidney disease Neg Hx   . Prostate cancer Neg Hx   . Breast cancer Neg Hx     Social History Social History   Tobacco Use  . Smoking status: Never Smoker  . Smokeless tobacco: Never Used  Substance Use Topics  . Alcohol use: No    Alcohol/week: 0.0 standard drinks  . Drug use: No    Review of Systems  Constitutional: Negative for fever. Eyes: Negative for visual changes. ENT: Negative for sore throat. Neck: No neck pain  Cardiovascular: Negative for chest pain. Respiratory: Negative for shortness of breath. Gastrointestinal: Negative for abdominal pain, vomiting or diarrhea. Genitourinary: Negative for dysuria. Musculoskeletal: Negative for back pain. Skin: Negative for rash. Neurological: Negative for headaches, weakness or numbness. Psych: No SI or HI  ____________________________________________   PHYSICAL EXAM:  VITAL SIGNS: ED Triage Vitals  Enc Vitals Group     BP 06/22/20 2333 (!) 147/92     Pulse Rate 06/22/20 2333 62     Resp 06/22/20 2333 16     Temp 06/22/20 2333 98.1 F (36.7 C)     Temp Source 06/22/20 2333 Oral     SpO2 06/22/20 2333 100 %     Weight 06/22/20 2331 151 lb (68.5 kg)     Height 06/22/20 2331 5\' 6"  (1.676 m)     Head  Circumference --      Peak Flow --      Pain Score 06/22/20 2330 0     Pain Loc --      Pain Edu? --      Excl. in [place]? --     Constitutional: Alert and oriented. Well appearing and in no apparent distress. HEENT:      Head: Normocephalic and atraumatic.         Eyes: Conjunctivae are normal. Sclera is non-icteric.       Mouth/Throat: Mucous membranes are moist.       Neck: Supple with no signs of meningismus. Cardiovascular: Regular rate and rhythm.  Respiratory: Normal respiratory effort.  Gastrointestinal: Soft, non tender. Musculoskeletal:  No open wounds on arms and hands. No edema,  cyanosis, or erythema of extremities. Neurologic: Normal speech and language. Face is symmetric. Moving all extremities. No gross focal neurologic deficits are appreciated. Skin: Skin is warm, dry and intact. No rash noted. Psychiatric: Mood and affect are normal. Speech and behavior are normal.  ____________________________________________   LABS (all labs ordered are listed, but only abnormal results are displayed)  Labs Reviewed - No data to display ____________________________________________  EKG  none  ____________________________________________  RADIOLOGY  none  ____________________________________________   PROCEDURES  Procedure(s) performed: None Procedures Critical Care performed:  None ____________________________________________   INITIAL IMPRESSION / ASSESSMENT AND PLAN / ED COURSE  50 y.o. female with history of non-Hodgkin's lymphoma in remission  who presents for evaluation of possible exposure to rabies after being exposed to the saliva of a dead fox.  Animal control has been contacted and will collect the animal in the beginning of the week.  Patient did receive the vaccination series for rabies over 20 years ago but missed the last dose.  She is not currently immune suppressed.  She was not bitten by the animal has no open wounds in her hands or arms.  She did touch the dog that had blood and saliva all over it possibly from the [place].  We discussed risks and benefits of vaccine and immunoglobulins chose to receive both.  She received the first dose of the vaccine here.  Will discuss follow-up before the future doses and my standard return precautions       _____________________________________________ Please note:  Patient was evaluated in Emergency Department today for the symptoms described in the history of present illness. Patient was evaluated in the context of the global COVID-19 pandemic, which necessitated consideration that the patient might be at  risk for infection with the SARS-CoV-2 virus that causes COVID-19. Institutional protocols and algorithms that pertain to the evaluation of patients at risk for COVID-19 are in a state of rapid change based on information released by regulatory bodies including the CDC and federal and state organizations. These policies and algorithms were followed during the patient's care in the ED.  Some ED evaluations and interventions may be delayed as a result of limited staffing during the pandemic.   [place] Controlled Substance Database was reviewed by me. ____________________________________________   FINAL CLINICAL IMPRESSION(S) / ED DIAGNOSES   Final diagnoses:  Contact with and suspected exposure to rabies      NEW MEDICATIONS STARTED DURING THIS VISIT:  ED Discharge Orders    None       Note:  This document was prepared using Dragon voice recognition software and may include unintentional dictation errors.    Alfred Levins, [place], MD 06/23/20 [phone]

## 2020-06-23 NOTE — ED Notes (Signed)
Spoke with Film/video editor with [facility] de[facility]. Per officer ray will contact animal control to pick up fox on Tuesday at pts residence. Pt states animal is dead in her possession. Pt provided with contact information for officer ray.

## 2020-06-23 NOTE — ED Notes (Signed)
NAD at this time. Pt ambulatory to treatment room.

## 2020-06-23 NOTE — Discharge Instructions (Addendum)
Rabies vaccination series consists of 4 shots.  To receive the following 3 shots please go to the above mentioned Urgent Cares.  Below are the dates for the next shots.  5/1 5/5 5/12

## 2020-06-26 ENCOUNTER — Ambulatory Visit: Payer: Self-pay

## 2020-06-26 ENCOUNTER — Other Ambulatory Visit: Payer: Self-pay

## 2020-06-26 ENCOUNTER — Ambulatory Visit
Admission: RE | Admit: 2020-06-26 | Discharge: 2020-06-26 | Disposition: A | Discharge status: Home / Self Care | Payer: Self-pay | Source: Ambulatory Visit | Attending: Emergency Medicine | Admitting: Emergency Medicine

## 2020-06-26 DIAGNOSIS — Z203 Contact with and (suspected) exposure to rabies: Secondary | ICD-10-CM

## 2020-06-26 MED ORDER — RABIES VACCINE, PCEC IM SUSR
1.0000 mL | Freq: Once | INTRAMUSCULAR | Status: DC
  Administered 2020-06-26: 1 mL via INTRAMUSCULAR

## 2020-06-26 NOTE — ED Triage Notes (Signed)
Pt here for 2nd rabies vaccine

## 2021-01-30 ENCOUNTER — Other Ambulatory Visit: Payer: Self-pay | Admitting: Obstetrics and Gynecology

## 2021-01-30 DIAGNOSIS — Z1231 Encounter for screening mammogram for malignant neoplasm of breast: Secondary | ICD-10-CM

## 2021-03-11 ENCOUNTER — Ambulatory Visit
Admission: RE | Admit: 2021-03-11 | Discharge: 2021-03-11 | Disposition: A | Discharge status: Home / Self Care | Payer: Self-pay | Source: Ambulatory Visit | Attending: Obstetrics and Gynecology | Admitting: Obstetrics and Gynecology

## 2021-03-11 ENCOUNTER — Other Ambulatory Visit: Payer: Self-pay

## 2021-03-11 DIAGNOSIS — Z1231 Encounter for screening mammogram for malignant neoplasm of breast: Secondary | ICD-10-CM | POA: Insufficient documentation

## 2021-11-24 ENCOUNTER — Other Ambulatory Visit: Payer: Self-pay | Admitting: Sports Medicine

## 2021-11-24 DIAGNOSIS — M79641 Pain in right hand: Secondary | ICD-10-CM

## 2021-12-29 ENCOUNTER — Ambulatory Visit
Admission: RE | Admit: 2021-12-29 | Discharge: 2021-12-29 | Disposition: A | Discharge status: Home / Self Care | Payer: No Typology Code available for payment source | Source: Ambulatory Visit | Attending: Sports Medicine | Admitting: Sports Medicine

## 2021-12-29 DIAGNOSIS — M79641 Pain in right hand: Secondary | ICD-10-CM

## 2022-02-16 ENCOUNTER — Other Ambulatory Visit: Payer: Self-pay | Admitting: Obstetrics and Gynecology

## 2022-02-16 DIAGNOSIS — Z1231 Encounter for screening mammogram for malignant neoplasm of breast: Secondary | ICD-10-CM

## 2022-03-12 ENCOUNTER — Ambulatory Visit
Admission: RE | Admit: 2022-03-12 | Discharge: 2022-03-12 | Disposition: A | Discharge status: Home / Self Care | Payer: Self-pay | Source: Ambulatory Visit | Attending: Obstetrics and Gynecology | Admitting: Obstetrics and Gynecology

## 2022-03-12 DIAGNOSIS — Z1231 Encounter for screening mammogram for malignant neoplasm of breast: Secondary | ICD-10-CM | POA: Insufficient documentation

## 2022-04-13 IMAGING — MG MM DIGITAL SCREENING BILAT W/ TOMO AND CAD
8 series · 9 of 24 positions shown · non-contrast
Comparison: Previous exam(s).

CLINICAL DATA: Screening.

EXAM:
DIGITAL SCREENING BILATERAL MAMMOGRAM WITH TOMOSYNTHESIS AND CAD
TECHNIQUE: Bilateral screening digital craniocaudal and mediolateral oblique
mammograms were obtained. Bilateral screening digital breast
tomosynthesis was performed. The images were evaluated with
computer-aided detection.

[R CC synth-2D]
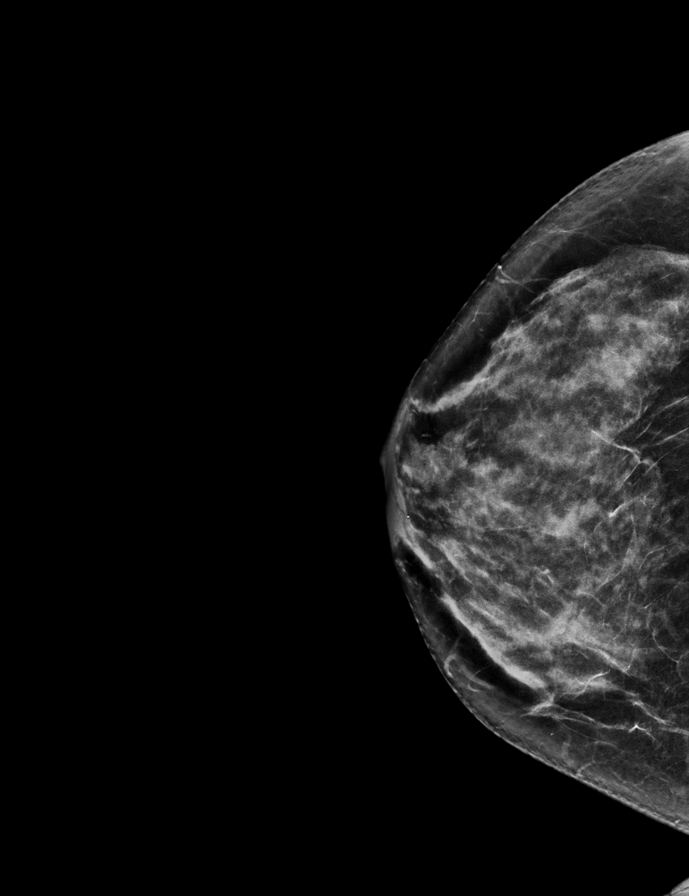

[R MLO synth-2D]
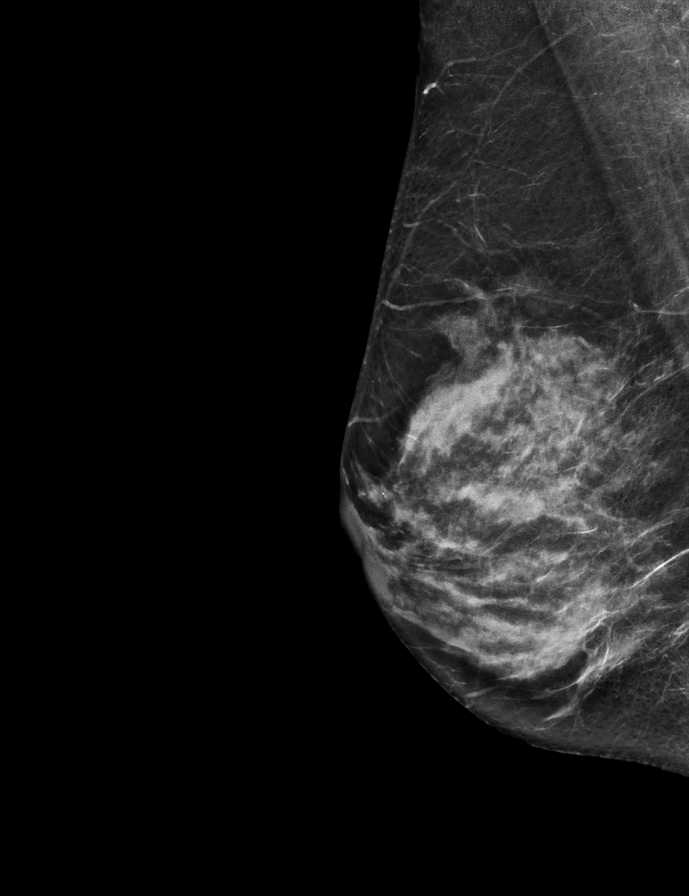

[L CC synth-2D]
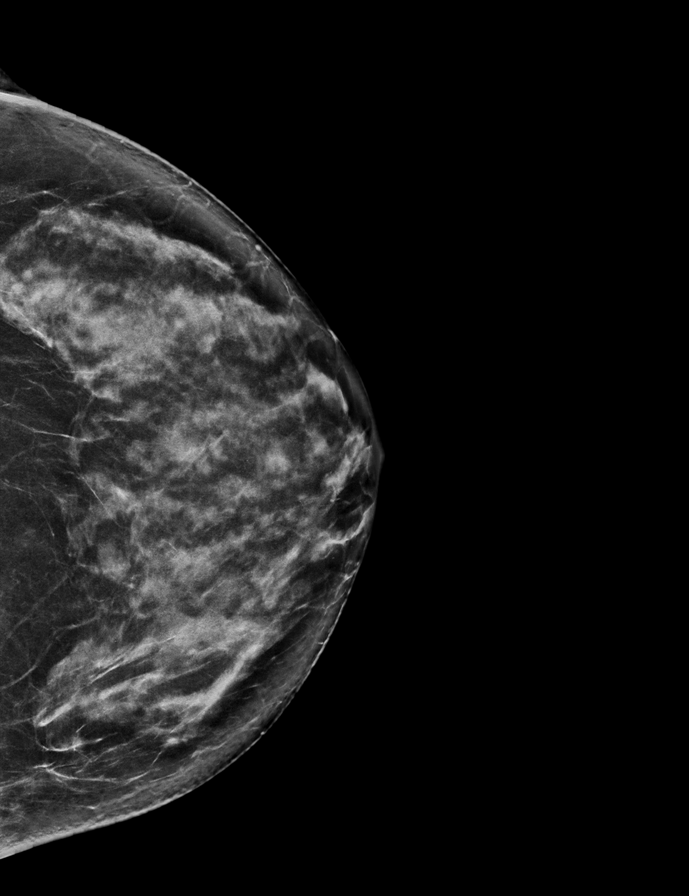

[L MLO synth-2D]
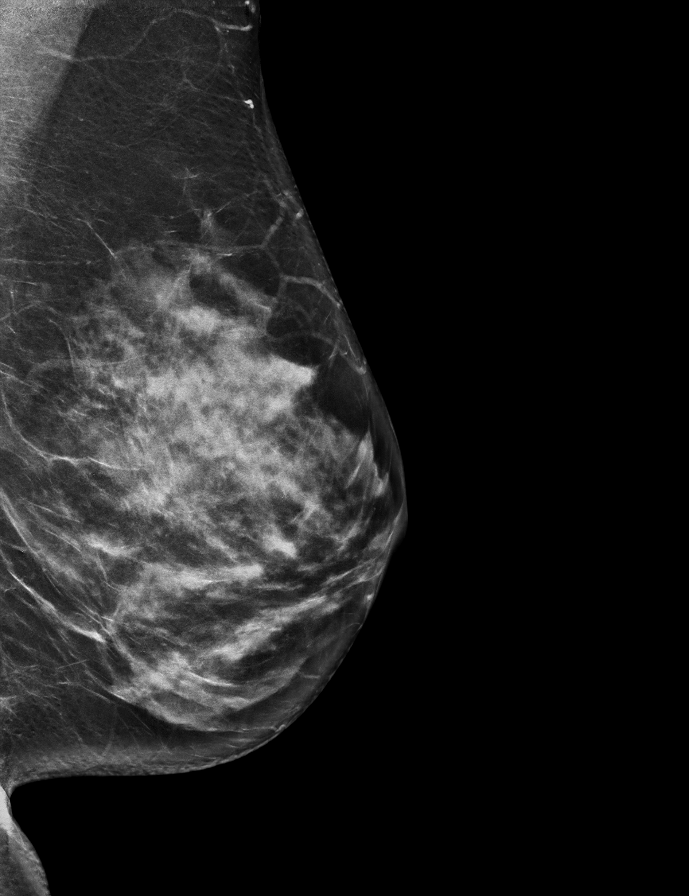

[L MLO tomo · 2 of 75 frames shown]
[frame 25/75]
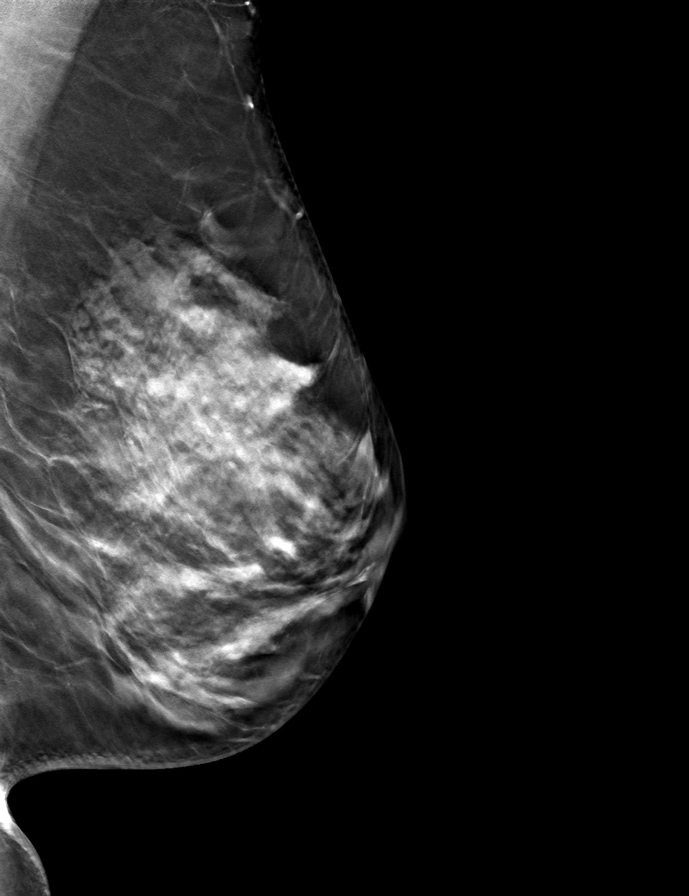
[frame 38/75]
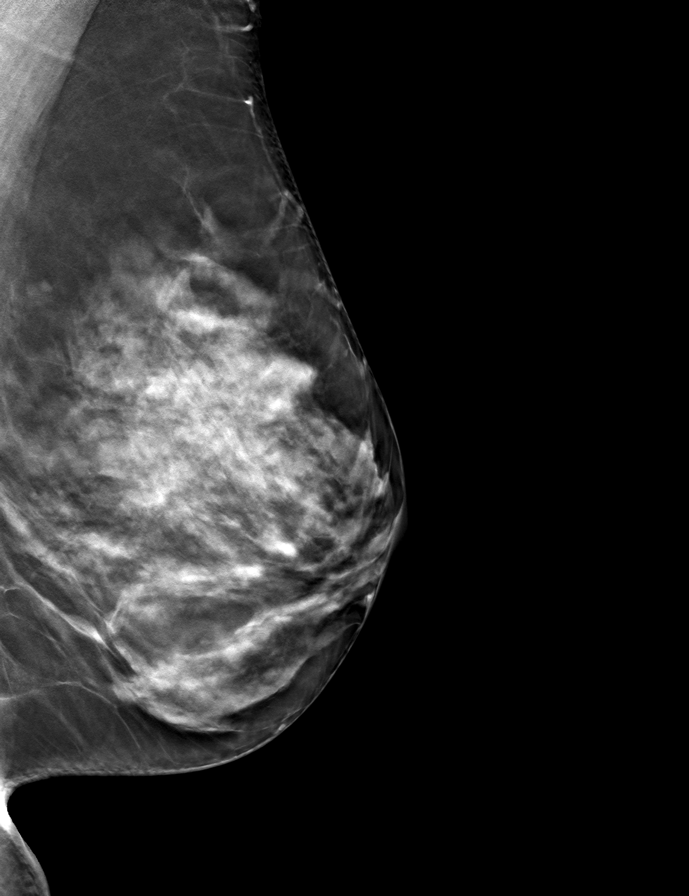

[L CC tomo · tomo slice 35/70.0]
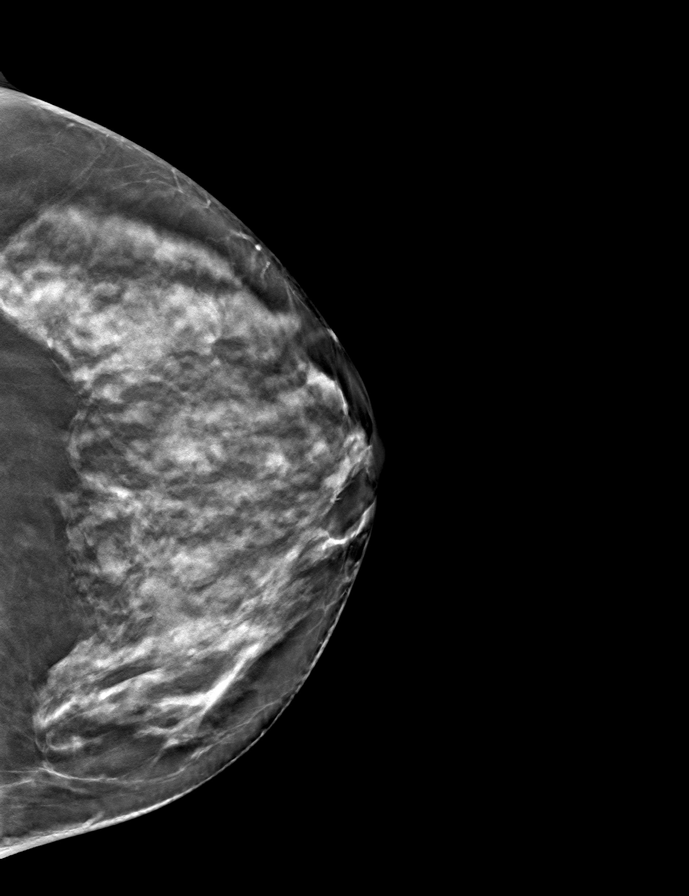

[R CC tomo · tomo slice 32/63.0]
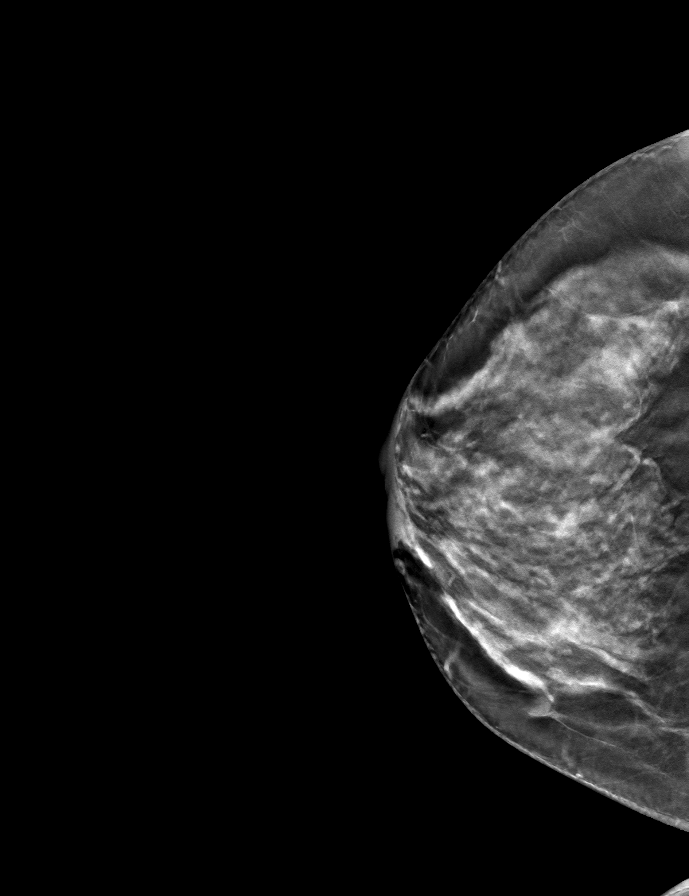

[R MLO tomo · tomo slice 33/65.0]
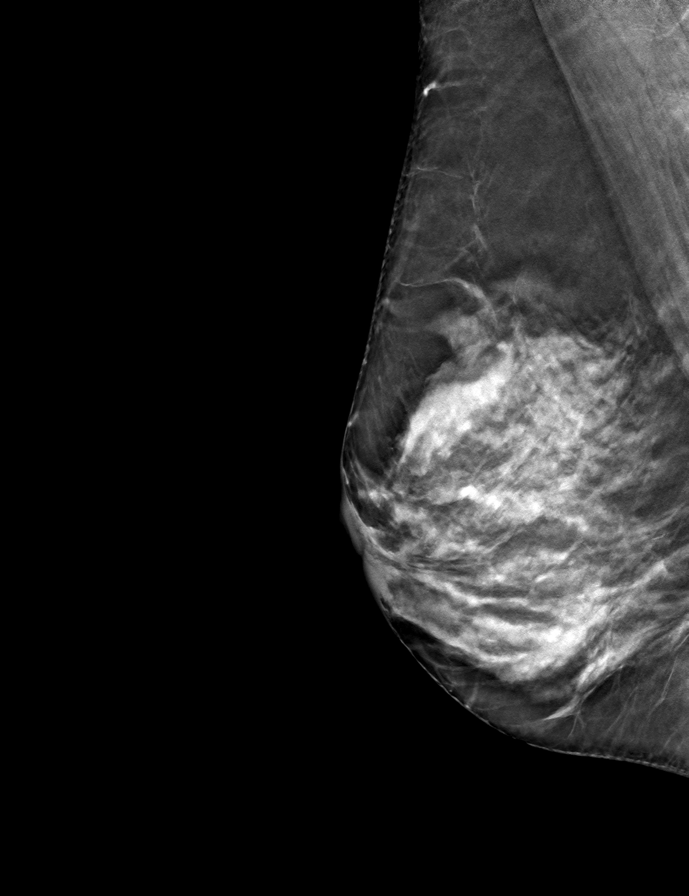

[9 of 24 positions shown; findings below may reference images not displayed]

ACR Breast Density Category c: The breast tissue is heterogeneously
dense, which may obscure small masses.
FINDINGS: There are no findings suspicious for malignancy.
IMPRESSION: No mammographic evidence of malignancy. A result letter of this
screening mammogram will be mailed directly to the patient.

RECOMMENDATION:
Screening mammogram in one year. (Code:Q3-W-BC3)

BI-RADS CATEGORY  1: Negative.

## 2023-03-23 ENCOUNTER — Other Ambulatory Visit: Payer: Self-pay | Admitting: Obstetrics and Gynecology

## 2023-03-23 DIAGNOSIS — Z1231 Encounter for screening mammogram for malignant neoplasm of breast: Secondary | ICD-10-CM

## 2023-04-08 ENCOUNTER — Ambulatory Visit
Admission: RE | Admit: 2023-04-08 | Discharge: 2023-04-08 | Disposition: A | Discharge status: Home / Self Care | Payer: Self-pay | Source: Ambulatory Visit | Attending: Obstetrics and Gynecology | Admitting: Obstetrics and Gynecology

## 2023-04-08 DIAGNOSIS — Z1231 Encounter for screening mammogram for malignant neoplasm of breast: Secondary | ICD-10-CM | POA: Insufficient documentation

## 2023-11-08 ENCOUNTER — Ambulatory Visit
Admission: RE | Admit: 2023-11-08 | Discharge: 2023-11-08 | Disposition: A | Payer: Self-pay | Source: Ambulatory Visit | Attending: Family

## 2023-11-08 ENCOUNTER — Other Ambulatory Visit: Payer: Self-pay | Admitting: Family

## 2023-11-08 DIAGNOSIS — Z8572 Personal history of non-Hodgkin lymphomas: Secondary | ICD-10-CM

## 2023-11-08 DIAGNOSIS — M542 Cervicalgia: Secondary | ICD-10-CM
